# Patient Record
Sex: Male | Born: 1980 | Race: Black or African American | Hispanic: No | Marital: Single
Health system: Southern US, Community
[De-identification: ages and names within clinical notes are randomized; demographics above are authoritative.]

---

## 1999-07-26 ENCOUNTER — Emergency Department (HOSPITAL_COMMUNITY): Admission: EM | Admit: 1999-07-26 | Discharge: 1999-07-26 | Payer: Self-pay | Admitting: Emergency Medicine

## 1999-07-26 ENCOUNTER — Encounter: Payer: Self-pay | Admitting: Emergency Medicine

## 2004-03-13 ENCOUNTER — Emergency Department (HOSPITAL_COMMUNITY): Admission: EM | Admit: 2004-03-13 | Discharge: 2004-03-13 | Payer: Self-pay | Admitting: Family Medicine

## 2018-10-17 ENCOUNTER — Emergency Department (HOSPITAL_COMMUNITY): Admission: EM | Admit: 2018-10-17 | Discharge: 2018-10-17 | Discharge status: Against Medical Advice | Payer: Self-pay

## 2018-10-17 NOTE — ED Notes (Signed)
Called for triage and still unable to find. RN made aware.

## 2018-10-17 NOTE — ED Notes (Signed)
Called pt multiple times and unable to locate for triage.

## 2019-06-19 ENCOUNTER — Emergency Department (HOSPITAL_COMMUNITY)
Admission: EM | Admit: 2019-06-19 | Discharge: 2019-06-20 | Disposition: A | Discharge status: Home / Self Care | Payer: Self-pay | Attending: Emergency Medicine | Admitting: Emergency Medicine

## 2019-06-19 ENCOUNTER — Encounter (HOSPITAL_COMMUNITY): Payer: Self-pay

## 2019-06-19 DIAGNOSIS — Y929 Unspecified place or not applicable: Secondary | ICD-10-CM | POA: Insufficient documentation

## 2019-06-19 DIAGNOSIS — Y999 Unspecified external cause status: Secondary | ICD-10-CM | POA: Insufficient documentation

## 2019-06-19 DIAGNOSIS — Y939 Activity, unspecified: Secondary | ICD-10-CM | POA: Insufficient documentation

## 2019-06-19 DIAGNOSIS — S99921A Unspecified injury of right foot, initial encounter: Secondary | ICD-10-CM | POA: Insufficient documentation
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
## 2019-06-20 ENCOUNTER — Emergency Department (HOSPITAL_COMMUNITY): Payer: Self-pay

## 2019-06-20 MED ORDER — ACETAMINOPHEN 500 MG PO TABS
1000.0000 mg | ORAL_TABLET | Freq: Three times a day (TID) | ORAL | 0 refills | Status: AC
Start: 2019-06-20 — End: 2019-06-25

## 2019-06-20 MED ORDER — ACETAMINOPHEN 500 MG PO TABS
1000.0000 mg | ORAL_TABLET | Freq: Once | ORAL | Status: AC
  Administered 2019-06-20: 1000 mg via ORAL
  Filled 2019-06-20: qty 2

## 2019-06-20 NOTE — ED Notes (Signed)
Patient verbalizes understanding of discharge instructions. Opportunity for questioning and answers were provided. Armband removed by staff, pt discharged from ED. Pt. ambulatory and discharged home.  
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
